# Patient Record
Sex: Female | Born: 1940 | Hispanic: No | Marital: Married | Smoking: Never smoker
Health system: Southern US, Community
[De-identification: ages and names within clinical notes are randomized; demographics above are authoritative.]

## PROBLEM LIST (undated history)

## (undated) DIAGNOSIS — E119 Type 2 diabetes mellitus without complications: Secondary | ICD-10-CM

## (undated) DIAGNOSIS — I82409 Acute embolism and thrombosis of unspecified deep veins of unspecified lower extremity: Secondary | ICD-10-CM

## (undated) DIAGNOSIS — I10 Essential (primary) hypertension: Secondary | ICD-10-CM

---

## 2000-10-08 ENCOUNTER — Ambulatory Visit (HOSPITAL_BASED_OUTPATIENT_CLINIC_OR_DEPARTMENT_OTHER): Admission: RE | Admit: 2000-10-08 | Discharge: 2000-10-08 | Payer: Self-pay | Admitting: Orthopaedic Surgery

## 2015-06-29 ENCOUNTER — Emergency Department (HOSPITAL_BASED_OUTPATIENT_CLINIC_OR_DEPARTMENT_OTHER): Payer: Medicare Other

## 2015-06-29 ENCOUNTER — Emergency Department (HOSPITAL_BASED_OUTPATIENT_CLINIC_OR_DEPARTMENT_OTHER)
Admission: EM | Admit: 2015-06-29 | Discharge: 2015-06-29 | Disposition: A | Discharge status: Home / Self Care | Payer: Medicare Other | Attending: Emergency Medicine | Admitting: Emergency Medicine

## 2015-06-29 ENCOUNTER — Encounter (HOSPITAL_BASED_OUTPATIENT_CLINIC_OR_DEPARTMENT_OTHER): Payer: Self-pay | Admitting: *Deleted

## 2015-06-29 DIAGNOSIS — Z7982 Long term (current) use of aspirin: Secondary | ICD-10-CM | POA: Diagnosis not present

## 2015-06-29 DIAGNOSIS — R1013 Epigastric pain: Secondary | ICD-10-CM | POA: Insufficient documentation

## 2015-06-29 DIAGNOSIS — Z79899 Other long term (current) drug therapy: Secondary | ICD-10-CM | POA: Diagnosis not present

## 2015-06-29 DIAGNOSIS — R509 Fever, unspecified: Secondary | ICD-10-CM | POA: Insufficient documentation

## 2015-06-29 DIAGNOSIS — E119 Type 2 diabetes mellitus without complications: Secondary | ICD-10-CM | POA: Insufficient documentation

## 2015-06-29 DIAGNOSIS — R112 Nausea with vomiting, unspecified: Secondary | ICD-10-CM | POA: Insufficient documentation

## 2015-06-29 DIAGNOSIS — R197 Diarrhea, unspecified: Secondary | ICD-10-CM | POA: Diagnosis not present

## 2015-06-29 DIAGNOSIS — I10 Essential (primary) hypertension: Secondary | ICD-10-CM | POA: Insufficient documentation

## 2015-06-29 DIAGNOSIS — Z86718 Personal history of other venous thrombosis and embolism: Secondary | ICD-10-CM | POA: Insufficient documentation

## 2015-06-29 HISTORY — DX: Acute embolism and thrombosis of unspecified deep veins of unspecified lower extremity: I82.409

## 2015-06-29 HISTORY — DX: Essential (primary) hypertension: I10

## 2015-06-29 HISTORY — DX: Type 2 diabetes mellitus without complications: E11.9

## 2015-06-29 LAB — URINALYSIS, ROUTINE W REFLEX MICROSCOPIC
Glucose, UA: NEGATIVE mg/dL
Hgb urine dipstick: NEGATIVE
KETONES UR: 40 mg/dL — AB
NITRITE: NEGATIVE
Protein, ur: NEGATIVE mg/dL
Specific Gravity, Urine: 1.025 (ref 1.005–1.030)
pH: 6 (ref 5.0–8.0)

## 2015-06-29 LAB — CBC WITH DIFFERENTIAL/PLATELET
BASOS PCT: 0 %
Basophils Absolute: 0 10*3/uL (ref 0.0–0.1)
EOS ABS: 0 10*3/uL (ref 0.0–0.7)
Eosinophils Relative: 1 %
HCT: 38.9 % (ref 36.0–46.0)
HEMOGLOBIN: 14.1 g/dL (ref 12.0–15.0)
Lymphocytes Relative: 18 %
Lymphs Abs: 0.8 10*3/uL (ref 0.7–4.0)
MCH: 31.5 pg (ref 26.0–34.0)
MCHC: 36.2 g/dL — AB (ref 30.0–36.0)
MCV: 87 fL (ref 78.0–100.0)
MONOS PCT: 12 %
Monocytes Absolute: 0.6 10*3/uL (ref 0.1–1.0)
NEUTROS PCT: 69 %
Neutro Abs: 3.3 10*3/uL (ref 1.7–7.7)
PLATELETS: 184 10*3/uL (ref 150–400)
RBC: 4.47 MIL/uL (ref 3.87–5.11)
RDW: 13.9 % (ref 11.5–15.5)
WBC: 4.8 10*3/uL (ref 4.0–10.5)

## 2015-06-29 LAB — URINE MICROSCOPIC-ADD ON

## 2015-06-29 LAB — TROPONIN I: Troponin I: <0.03 ng/mL (ref <0.031)

## 2015-06-29 LAB — COMPREHENSIVE METABOLIC PANEL
ALT: 21 U/L (ref 14–54)
ANION GAP: 9 (ref 5–15)
AST: 30 U/L (ref 15–41)
Albumin: 3.6 g/dL (ref 3.5–5.0)
Alkaline Phosphatase: 37 U/L — ABNORMAL LOW (ref 38–126)
BUN: 15 mg/dL (ref 6–20)
CO2: 28 mmol/L (ref 22–32)
Calcium: 8.2 mg/dL — ABNORMAL LOW (ref 8.9–10.3)
Chloride: 99 mmol/L — ABNORMAL LOW (ref 101–111)
Creatinine, Ser: 0.83 mg/dL (ref 0.44–1.00)
GFR calc Af Amer: >60 mL/min (ref >60)
Glucose, Bld: 138 mg/dL — ABNORMAL HIGH (ref 65–99)
POTASSIUM: 2.7 mmol/L — AB (ref 3.5–5.1)
SODIUM: 136 mmol/L (ref 135–145)
TOTAL PROTEIN: 6.1 g/dL — AB (ref 6.5–8.1)
Total Bilirubin: 0.7 mg/dL (ref 0.3–1.2)

## 2015-06-29 LAB — I-STAT CG4 LACTIC ACID, ED: LACTIC ACID, VENOUS: 1.04 mmol/L (ref 0.5–2.0)

## 2015-06-29 LAB — LIPASE, BLOOD: Lipase: 14 U/L (ref 11–51)

## 2015-06-29 MED ORDER — SODIUM CHLORIDE 0.9 % IV BOLUS (SEPSIS)
1000.0000 mL | Freq: Once | INTRAVENOUS | Status: AC
  Administered 2015-06-29: 1000 mL via INTRAVENOUS

## 2015-06-29 MED ORDER — ONDANSETRON HCL 4 MG/2ML IJ SOLN
4.0000 mg | Freq: Once | INTRAMUSCULAR | Status: AC
  Administered 2015-06-29: 4 mg via INTRAVENOUS
  Filled 2015-06-29: qty 2

## 2015-06-29 MED ORDER — IOPAMIDOL (ISOVUE-300) INJECTION 61%
100.0000 mL | Freq: Once | INTRAVENOUS | Status: AC | PRN
  Administered 2015-06-29: 100 mL via INTRAVENOUS

## 2015-06-29 MED ORDER — ONDANSETRON 4 MG PO TBDP: ORAL_TABLET | ORAL | Status: AC

## 2015-06-29 MED ORDER — ONDANSETRON HCL 4 MG PO TABS: 4.0000 mg | ORAL_TABLET | Freq: Four times a day (QID) | ORAL | Status: DC

## 2015-06-29 MED ORDER — MAGNESIUM SULFATE 2 GM/50ML IV SOLN
2.0000 g | Freq: Once | INTRAVENOUS | Status: AC
  Administered 2015-06-29: 2 g via INTRAVENOUS
  Filled 2015-06-29: qty 50

## 2015-06-29 MED ORDER — MAGNESIUM OXIDE 400 (241.3 MG) MG PO TABS
800.0000 mg | ORAL_TABLET | Freq: Once | ORAL | Status: DC
  Filled 2015-06-29: qty 2

## 2015-06-29 MED ORDER — POTASSIUM CHLORIDE CRYS ER 20 MEQ PO TBCR
40.0000 meq | EXTENDED_RELEASE_TABLET | Freq: Once | ORAL | Status: AC
  Administered 2015-06-29: 40 meq via ORAL
  Filled 2015-06-29: qty 2

## 2015-06-29 MED ORDER — POTASSIUM CHLORIDE 10 MEQ/100ML IV SOLN
10.0000 meq | Freq: Once | INTRAVENOUS | Status: AC
  Administered 2015-06-29: 10 meq via INTRAVENOUS
  Filled 2015-06-29: qty 100

## 2015-06-29 MED ORDER — MORPHINE SULFATE (PF) 4 MG/ML IV SOLN
4.0000 mg | Freq: Once | INTRAVENOUS | Status: AC
  Administered 2015-06-29: 4 mg via INTRAVENOUS
  Filled 2015-06-29: qty 1

## 2015-06-29 NOTE — Discharge Instructions (Signed)

## 2015-06-29 NOTE — ED Notes (Signed)
Potassium 2.7 relayed to Dr Adela LankFloyd.

## 2015-06-29 NOTE — ED Notes (Signed)
fluids

## 2015-06-29 NOTE — ED Notes (Signed)
Diet ginger ale and soda crackers given to pt.

## 2015-06-29 NOTE — ED Notes (Signed)
C/o n/v/d since Sunday. Unable to eat. Arrived via [facility]. Accu check per EMS was 116.  EMS started SL to left ac with 20 ga needle site unremarkable. No pain.

## 2015-06-29 NOTE — ED Notes (Signed)
Pt able to keep soda crackers and ginger ale down without difficulty.

## 2015-06-29 NOTE — ED Provider Notes (Signed)
CSN: [id number]     Arrival date & time 06/29/15  1524 History   First MD Initiated Contact with Patient 06/29/15 1528     No chief complaint on file.    (Consider location/radiation/quality/duration/timing/severity/associated sxs/prior Treatment) Patient is a 75 y.o. female presenting with abdominal pain. The history is provided by the patient.  Abdominal Pain Pain location:  Epigastric Pain quality: burning, cramping, sharp and shooting   Pain radiates to:  Does not radiate Pain severity:  Moderate Onset quality:  Gradual Duration:  2 days Timing:  Constant Progression:  Worsening Chronicity:  New Relieved by:  Nothing Worsened by:  Nothing tried Ineffective treatments:  None tried Associated symptoms: chills, diarrhea, fever, nausea and vomiting   Associated symptoms: no chest pain, no dysuria and no shortness of breath    74 yo F With a chief complaint of epigastric abdominal pain. This is a crampy pain coming and going. Patient is been vomiting and having diarrhea and fevers as well. Unable to keep anything down for the past couple days. Denies suspicious food intake denies sick contacts denies recent antibiotic use.  Past Medical History  Diagnosis Date  . DVT, lower extremity (HCC)   . Diabetes mellitus without complication (HCC)   . Hypertension    History reviewed. No pertinent past surgical history. No family history on file. Social History  Substance Use Topics  . Smoking status: Never Smoker   . Smokeless tobacco: None  . Alcohol Use: None   OB History    No data available     Review of Systems  Constitutional: Positive for fever and chills.  HENT: Negative for congestion and rhinorrhea.   Eyes: Negative for redness and visual disturbance.  Respiratory: Negative for shortness of breath and wheezing.   Cardiovascular: Negative for chest pain and palpitations.  Gastrointestinal: Positive for nausea, vomiting, abdominal pain and diarrhea.  Genitourinary:  Negative for dysuria and urgency.  Musculoskeletal: Negative for myalgias and arthralgias.  Skin: Negative for pallor and wound.  Neurological: Negative for dizziness and headaches.      Allergies  Chromium and Tdap  Home Medications   Prior to Admission medications   Medication Sig Start Date End Date Taking? Authorizing Provider  aspirin 81 MG tablet Take 81 mg by mouth daily.   Yes Historical Provider, MD  calcium & magnesium carbonates (MYLANTA) 409-811 MG tablet Take 1 tablet by mouth daily.   Yes Historical Provider, MD  escitalopram (LEXAPRO) 10 MG tablet Take 10 mg by mouth daily.   Yes Historical Provider, MD  furosemide (LASIX) 40 MG tablet Take 40 mg by mouth.   Yes Historical Provider, MD  hydrochlorothiazide (HYDRODIURIL) 25 MG tablet Take 25 mg by mouth daily.   Yes Historical Provider, MD  levothyroxine (SYNTHROID, LEVOTHROID) 100 MCG tablet Take 100 mcg by mouth daily before breakfast.   Yes Historical Provider, MD  lisinopril (PRINIVIL,ZESTRIL) 10 MG tablet Take 10 mg by mouth daily.   Yes Historical Provider, MD  omeprazole (PRILOSEC) 20 MG capsule Take 20 mg by mouth daily.   Yes Historical Provider, MD  ondansetron (ZOFRAN ODT) 4 MG disintegrating tablet  ODT q4 hours prn nausea/vomit 06/29/15   Melene Plan, DO  potassium chloride (K-DUR,KLOR-CON) 10 MEQ tablet Take 10 mEq by mouth 2 (two) times daily.   Yes Historical Provider, MD  pravastatin (PRAVACHOL) 40 MG tablet Take 40 mg by mouth daily.   Yes Historical Provider, MD  sitaGLIPtin-metformin (JANUMET) 50-500 MG tablet Take 1 tablet by mouth  2 (two) times daily with a meal.   Yes Historical Provider, MD   BP 107/73 mmHg  Pulse 70  Temp(Src) 98.5 F (36.9 C) (Oral)  Resp 18  Ht 5\' 2"  (1.575 m)  Wt 235 lb (106.595 kg)  BMI 42.97 kg/m2  SpO2 95% Physical Exam  Constitutional: She is oriented to person, place, and time. She appears well-developed and well-nourished. No distress.  HENT:  Head: Normocephalic  and atraumatic.  Eyes: EOM are normal. Pupils are equal, round, and reactive to light.  Neck: Normal range of motion. Neck supple.  Cardiovascular: Normal rate and regular rhythm.  Exam reveals no gallop and no friction rub.   No murmur heard. Pulmonary/Chest: Effort normal. She has no wheezes. She has no rales.  Abdominal: Soft. She exhibits no distension. There is tenderness (worst in RUQ, + murphys). There is no rebound and no guarding.  Musculoskeletal: She exhibits no edema or tenderness.  Neurological: She is alert and oriented to person, place, and time.  Skin: Skin is warm and dry. She is not diaphoretic.  Psychiatric: She has a normal mood and affect. Her behavior is normal.  Nursing note and vitals reviewed.   ED Course  Procedures (including critical care time) Labs Review Labs Reviewed  COMPREHENSIVE METABOLIC PANEL - Abnormal; Notable for the following:    Potassium 2.7 (*)    Chloride 99 (*)    Glucose, Bld 138 (*)    Calcium 8.2 (*)    Total Protein 6.1 (*)    Alkaline Phosphatase 37 (*)    All other components within normal limits  CBC WITH DIFFERENTIAL/PLATELET - Abnormal; Notable for the following:    MCHC 36.2 (*)    All other components within normal limits  URINALYSIS, ROUTINE W REFLEX MICROSCOPIC (NOT [facility]) - Abnormal; Notable for the following:    APPearance CLOUDY (*)    Bilirubin Urine SMALL (*)    Ketones, ur 40 (*)    Leukocytes, UA SMALL (*)    All other components within normal limits  URINE MICROSCOPIC-ADD ON - Abnormal; Notable for the following:    Squamous Epithelial / LPF 0-5 (*)    Bacteria, UA RARE (*)    All other components within normal limits  LIPASE, BLOOD  TROPONIN I  I-STAT CG4 LACTIC ACID, ED    Imaging Review Ct Abdomen Pelvis W Contrast  06/29/2015  ADDENDUM REPORT: 06/29/2015 18:14 ADDENDUM: 1.9 cm right adrenal nodule is noted. Follow-up CT scan or MRI in 6-12 months is recommended to ensure stability. Electronically  Signed   By: Lupita RaiderJames  Green Jr, M.D.   On: 06/29/2015 18:14  06/29/2015  CLINICAL DATA:  Nausea, vomiting, diarrhea. EXAM: CT ABDOMEN AND PELVIS WITH CONTRAST TECHNIQUE: Multidetector CT imaging of the abdomen and pelvis was performed using the standard protocol following bolus administration of intravenous contrast. CONTRAST:  100mL ISOVUE-300 IOPAMIDOL (ISOVUE-300) INJECTION 61% COMPARISON:  None. FINDINGS: Visualized lung bases are unremarkable. No significant osseous abnormality is noted. Status post cholecystectomy. Multiple cysts are noted in the liver, with the largest measuring 5.5 cm in the left hepatic lobe. The spleen and pancreas are unremarkable. Mild intrahepatic and extrahepatic biliary dilatation is noted most consistent with post cholecystectomy status. Left adrenal gland and both kidneys appear normal without hydronephrosis or renal obstruction. 1.9 cm right adrenal nodule is noted. No renal or ureteral calculi are noted. There is no evidence of bowel obstruction. No abnormal fluid collection is noted. Moderate size fat containing ventral hernia is noted  in the pelvis. Mild bilateral fat containing inguinal hernias are noted. Sigmoid diverticulosis is noted without inflammation. Urinary bladder appears normal. Patient appears to be status post hysterectomy. No significant adenopathy is noted. IMPRESSION: Moderate size fat containing ventral hernia is seen anteriorly in the pelvis. Sigmoid diverticulosis is noted without inflammation. Mild bilateral fat containing inguinal hernias are noted. Multiple hepatic cysts are noted. No acute abnormality is noted in the abdomen or pelvis. Electronically Signed: By: Lupita Raider, M.D. On: 06/29/2015 17:33   I have personally reviewed and evaluated these images and lab results as part of my medical decision-making.   EKG Interpretation   Date/Time:  Wednesday June 29 2015 16:23:57 EDT Ventricular Rate:  66 PR Interval:  128 QRS Duration: 98 QT  Interval:  406 QTC Calculation: 425 R Axis:   64 Text Interpretation:  Sinus rhythm Consider left atrial enlargement  Abnormal R-wave progression, early transition No old tracing to compare  Confirmed by Leiani Enright MD, DANIEL [phone]) on 06/29/2015 5:02:06 PM      MDM   Final diagnoses:  Nausea vomiting and diarrhea  Epigastric abdominal pain    75 yo F with a chief complaint of abdominal pain. Patient has a positive Murphy sign though has had a prior cholecystectomy. Will obtain a CT scan of abdomen and pelvis contrast. Labs to evaluate for acute hepatitis pancreatitis. Give fluids and Zofran recess.  CT scan was negative for acute pathology. Patient is able to tolerate by mouth in the ED without difficulty. Had a low potassium level this is repleted as well as her magnesium. Patient is feeling better requesting discharge home. We'll have her follow-up with her family physician.  9:07 PM:  I have discussed the diagnosis/risks/treatment options with the patient and believe the pt to be eligible for discharge home to follow-up with PCP. We also discussed returning to the ED immediately if new or worsening sx occur. We discussed the sx which are most concerning (e.g., sudden worsening pain, fever, inability to tolerate by mouth) that necessitate immediate return. Medications administered to the patient during their visit and any new prescriptions provided to the patient are listed below.  Medications given during this visit Medications  morphine 4 MG/ML injection 4 mg (4 mg Intravenous Given 06/29/15 1613)  ondansetron (ZOFRAN) injection 4 mg (4 mg Intravenous Given 06/29/15 1610)  sodium chloride 0.9 % bolus 1,000 mL (0 mLs Intravenous Stopped 06/29/15 1931)  potassium chloride 10 mEq in 100 mL IVPB (0 mEq Intravenous Stopped 06/29/15 1837)  potassium chloride SA (K-DUR,KLOR-CON) CR tablet 40 mEq (40 mEq Oral Given 06/29/15 1733)  iopamidol (ISOVUE-300) 61 % injection 100 mL (100 mLs Intravenous  Contrast Given 06/29/15 1715)  magnesium sulfate IVPB 2 g 50 mL (0 g Intravenous Stopped 06/29/15 1931)    Discharge Medication List as of 06/29/2015  6:54 PM    START taking these medications   Details  ondansetron (ZOFRAN ODT) 4 MG disintegrating tablet  ODT q4 hours prn nausea/vomit, Print        The patient appears reasonably screen and/or stabilized for discharge and I doubt any other medical condition or other EMC requiring further screening, evaluation, or treatment in the ED at this time prior to discharge.     Melene Plan, DO 06/29/15 2108

## 2017-02-15 IMAGING — CT CT ABD-PELV W/ CM
2 of 5 series · 17 of 46 positions shown, 19 images · IV contrast (APPLIED)
Comparison: None.

ADDENDUM:
1.9 cm right adrenal nodule is noted. Follow-up CT scan or MRI in
6-12 months is recommended to ensure stability.
CLINICAL DATA: Nausea, vomiting, diarrhea.

EXAM:
CT ABDOMEN AND PELVIS WITH CONTRAST
TECHNIQUE: Multidetector CT imaging of the abdomen and pelvis was performed
using the standard protocol following bolus administration of
intravenous contrast.
CONTRAST:  100mL [id number] IOPAMIDOL (XT641I-M11) INJECTION 61%

[Series 2: axial st · axial · 0.98mm/px · z∈[-446,-10]mm · 14 of 99 slices shown, 16 images]
[im 6/99  soft-tissue]
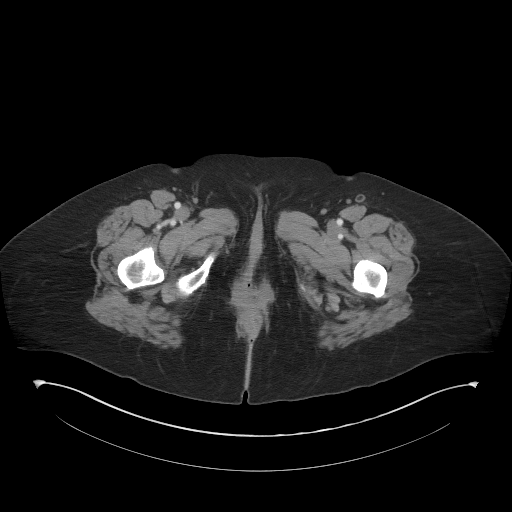
[im 6/99  bone]
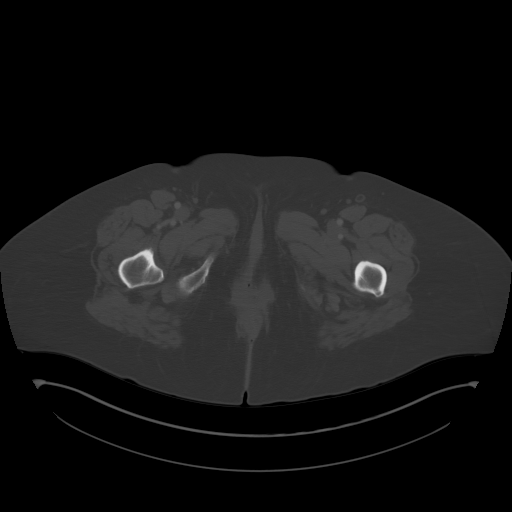
[im 12/99  soft-tissue]
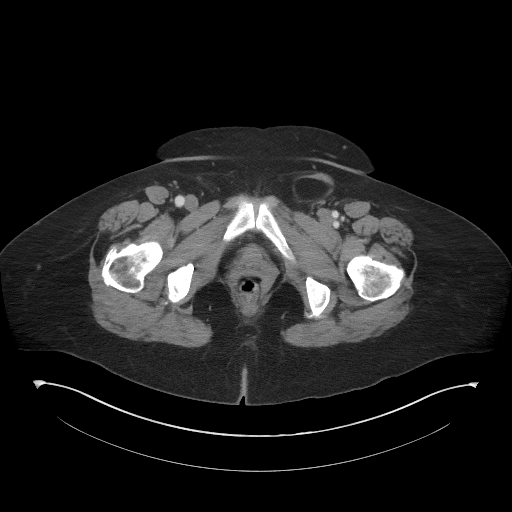
[im 18/99  soft-tissue]
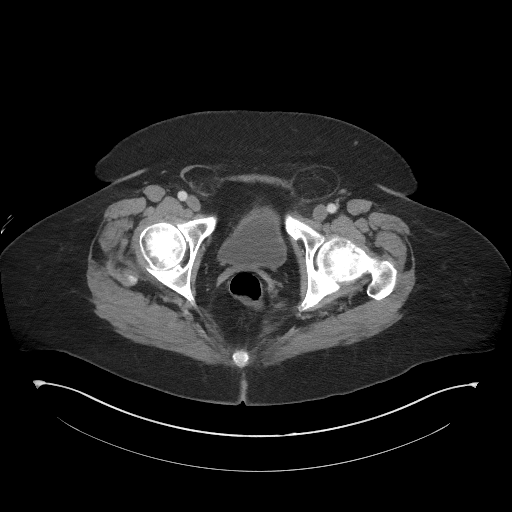
[im 29/99  soft-tissue]
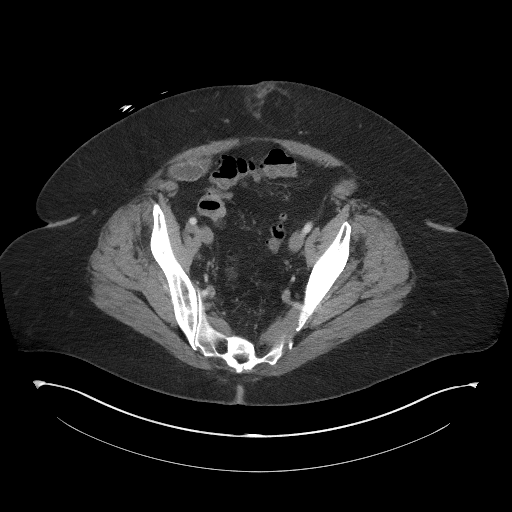
[im 35/99  soft-tissue]
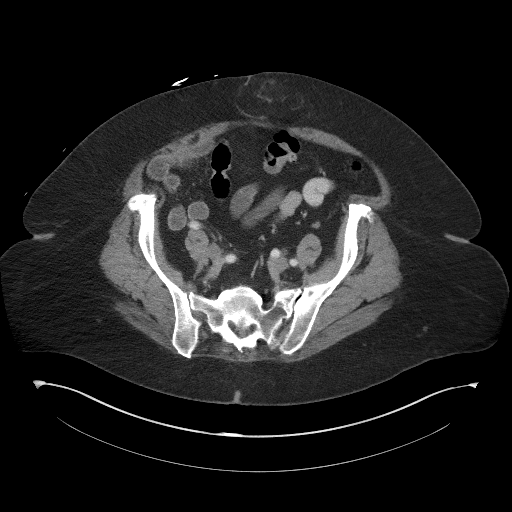
[im 41/99  soft-tissue]
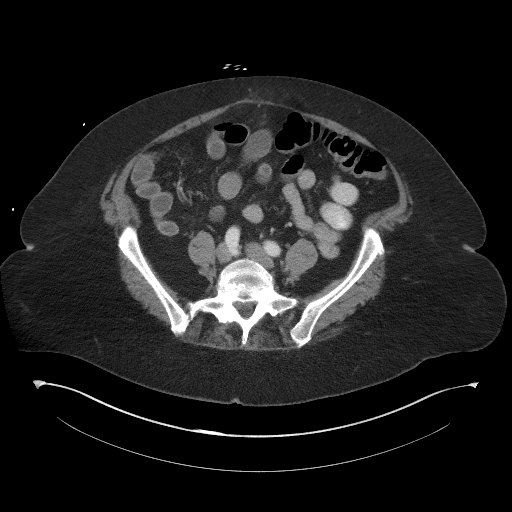
[im 47/99  soft-tissue]
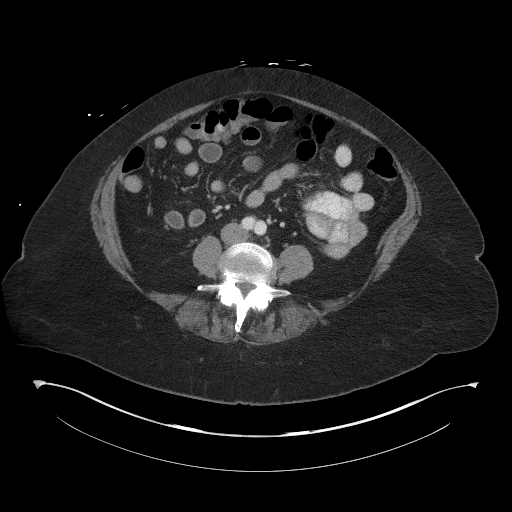
[im 52/99  soft-tissue]
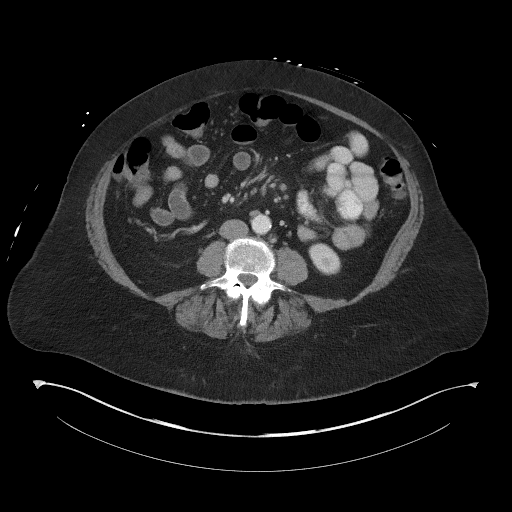
[im 58/99  soft-tissue]
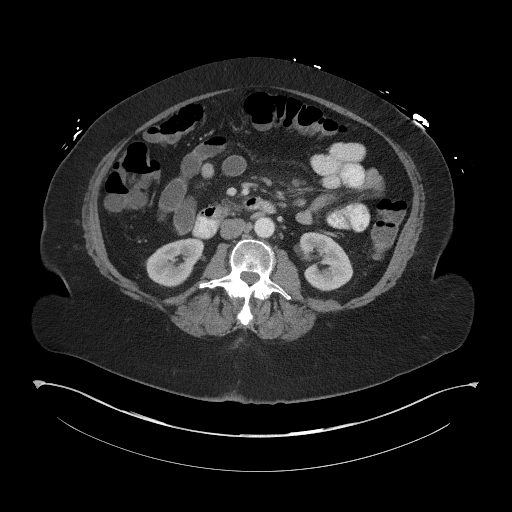
[im 58/99  bone]
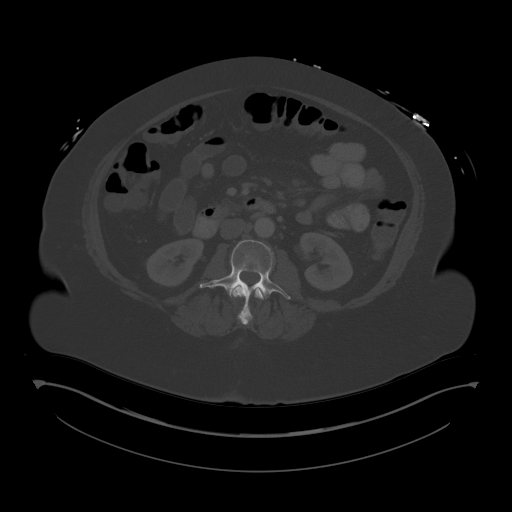
[im 64/99  soft-tissue]
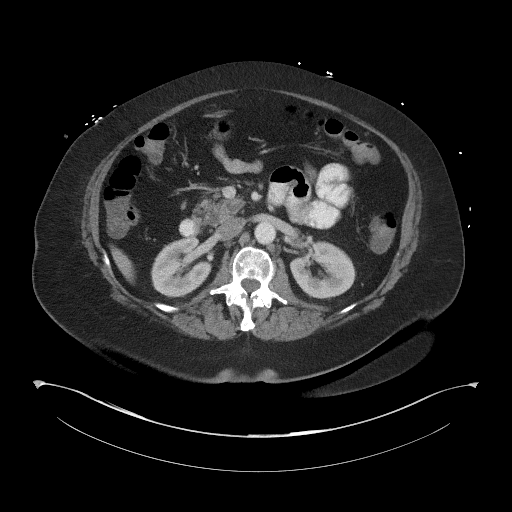
[im 75/99  soft-tissue]
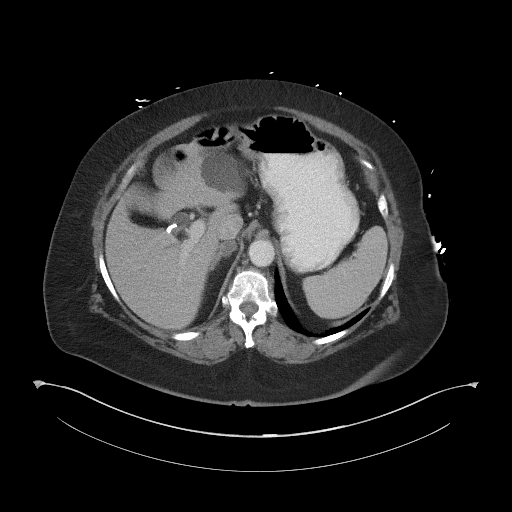
[im 81/99  soft-tissue]
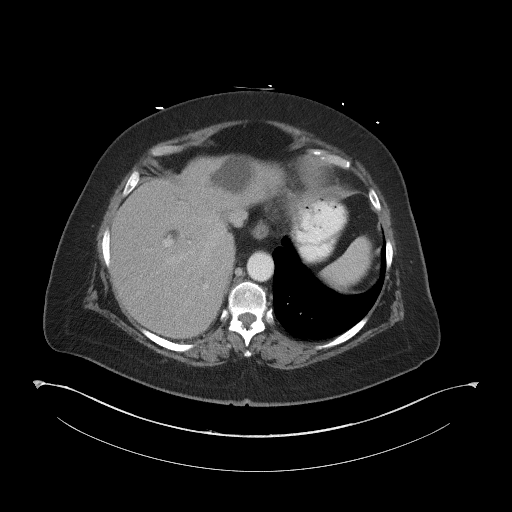
[im 87/99  soft-tissue]
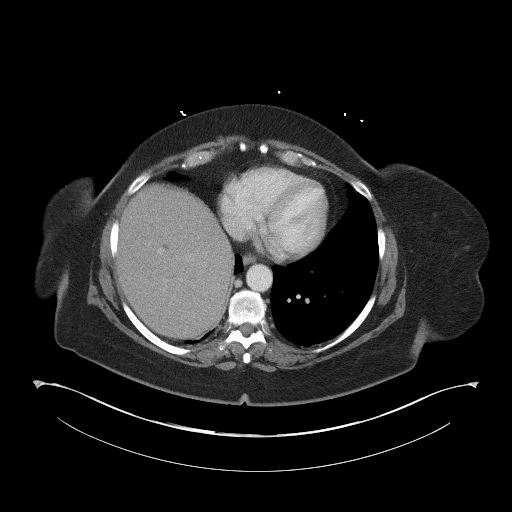
[im 93/99  soft-tissue]
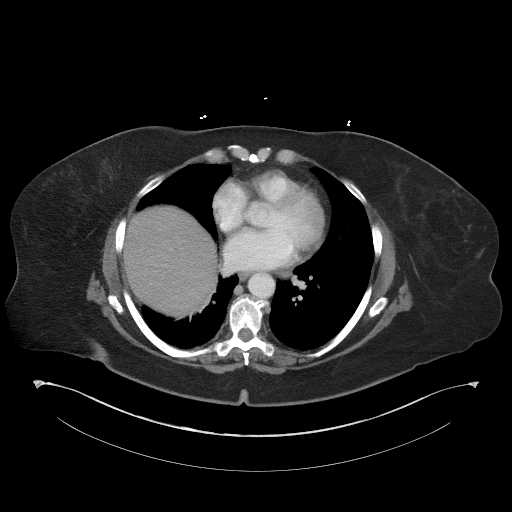

[Series 5: coronal st · coronal · 1.03mm/px · 3 of 98 slices shown]
[im 33/98  soft-tissue]
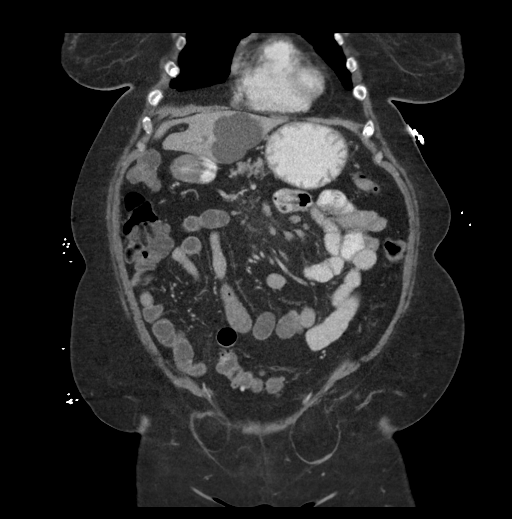
[im 44/98  soft-tissue]
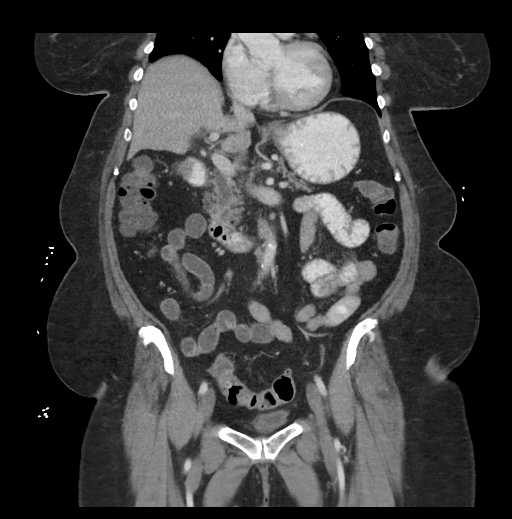
[im 54/98  soft-tissue]
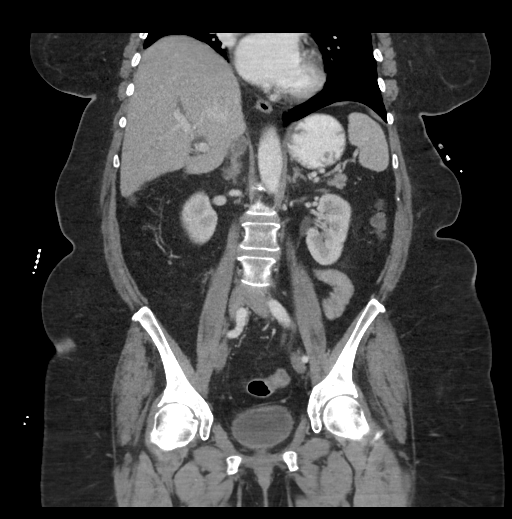

[17 of 46 positions shown; findings below may reference images not displayed]

FINDINGS: Visualized lung bases are unremarkable. No significant osseous
abnormality is noted.

Status post cholecystectomy. Multiple cysts are noted in the liver,
with the largest measuring 5.5 cm in the left hepatic lobe. The
spleen and pancreas are unremarkable. Mild intrahepatic and
extrahepatic biliary dilatation is noted most consistent with post
cholecystectomy status. Left adrenal gland and both kidneys appear
normal without hydronephrosis or renal obstruction. 1.9 cm right
adrenal nodule is noted. No renal or ureteral calculi are noted.
There is no evidence of bowel obstruction. No abnormal fluid
collection is noted. Moderate size fat containing ventral hernia is
noted in the pelvis. Mild bilateral fat containing inguinal hernias
are noted. Sigmoid diverticulosis is noted without inflammation.
Urinary bladder appears normal. Patient appears to be status post
hysterectomy. No significant adenopathy is noted.
IMPRESSION: Moderate size fat containing ventral hernia is seen anteriorly in
the pelvis.

Sigmoid diverticulosis is noted without inflammation.

Mild bilateral fat containing inguinal hernias are noted.

Multiple hepatic cysts are noted.

No acute abnormality is noted in the abdomen or pelvis.
